# Patient Record
Sex: Male | Born: 1995 | Race: Black or African American | Hispanic: No | Marital: Single | Smoking: Never smoker
Health system: Southern US, Community
[De-identification: ages and names within clinical notes are randomized; demographics above are authoritative.]

## PROBLEM LIST (undated history)

## (undated) DIAGNOSIS — J302 Other seasonal allergic rhinitis: Secondary | ICD-10-CM

---
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
## 2019-01-28 ENCOUNTER — Emergency Department (HOSPITAL_COMMUNITY): Payer: Medicaid Other

## 2019-01-28 ENCOUNTER — Emergency Department (HOSPITAL_COMMUNITY): Payer: Medicaid Other | Admitting: Certified Registered"

## 2019-01-28 ENCOUNTER — Encounter (HOSPITAL_COMMUNITY): Admission: EM | Disposition: E | Payer: Self-pay | Source: Home / Self Care

## 2019-01-28 ENCOUNTER — Inpatient Hospital Stay (HOSPITAL_COMMUNITY)
Admission: EM | Admit: 2019-01-28 | Discharge: 2019-02-27 | DRG: 329 | Disposition: E | Payer: Medicaid Other | Attending: General Surgery | Admitting: General Surgery

## 2019-01-28 DIAGNOSIS — Y249XXA Unspecified firearm discharge, undetermined intent, initial encounter: Secondary | ICD-10-CM

## 2019-01-28 DIAGNOSIS — R578 Other shock: Secondary | ICD-10-CM | POA: Diagnosis present

## 2019-01-28 DIAGNOSIS — Y92410 Unspecified street and highway as the place of occurrence of the external cause: Secondary | ICD-10-CM | POA: Diagnosis not present

## 2019-01-28 DIAGNOSIS — S35515A Injury of left iliac vein, initial encounter: Secondary | ICD-10-CM | POA: Diagnosis present

## 2019-01-28 DIAGNOSIS — S81832A Puncture wound without foreign body, left lower leg, initial encounter: Secondary | ICD-10-CM | POA: Diagnosis present

## 2019-01-28 DIAGNOSIS — W3400XA Accidental discharge from unspecified firearms or gun, initial encounter: Secondary | ICD-10-CM

## 2019-01-28 DIAGNOSIS — R58 Hemorrhage, not elsewhere classified: Secondary | ICD-10-CM | POA: Diagnosis present

## 2019-01-28 DIAGNOSIS — I468 Cardiac arrest due to other underlying condition: Secondary | ICD-10-CM | POA: Diagnosis present

## 2019-01-28 DIAGNOSIS — Z20822 Contact with and (suspected) exposure to covid-19: Secondary | ICD-10-CM | POA: Diagnosis present

## 2019-01-28 DIAGNOSIS — S31634A Puncture wound without foreign body of abdominal wall, left lower quadrant with penetration into peritoneal cavity, initial encounter: Secondary | ICD-10-CM | POA: Diagnosis present

## 2019-01-28 DIAGNOSIS — S36498A Other injury of other part of small intestine, initial encounter: Secondary | ICD-10-CM | POA: Diagnosis present

## 2019-01-28 DIAGNOSIS — S81831A Puncture wound without foreign body, right lower leg, initial encounter: Secondary | ICD-10-CM | POA: Diagnosis present

## 2019-01-28 DIAGNOSIS — S35512A Injury of left iliac artery, initial encounter: Secondary | ICD-10-CM | POA: Diagnosis present

## 2019-01-28 HISTORY — PX: REPAIR ILIAC ARTERY: SHX6216

## 2019-01-28 HISTORY — PX: LAPAROTOMY: SHX154

## 2019-01-28 LAB — I-STAT CHEM 8, ED
BUN: 13 mg/dL (ref 6–20)
Calcium, Ion: 1.06 mmol/L — ABNORMAL LOW (ref 1.15–1.40)
Chloride: 115 mmol/L — ABNORMAL HIGH (ref 98–111)
Creatinine, Ser: 1.8 mg/dL — ABNORMAL HIGH (ref 0.61–1.24)
Glucose, Bld: 42 mg/dL — CL (ref 70–99)
HCT: 29 % — ABNORMAL LOW (ref 39.0–52.0)
Hemoglobin: 9.9 g/dL — ABNORMAL LOW (ref 13.0–17.0)
Potassium: 3.4 mmol/L — ABNORMAL LOW (ref 3.5–5.1)
Sodium: 152 mmol/L — ABNORMAL HIGH (ref 135–145)
TCO2: 16 mmol/L — ABNORMAL LOW (ref 22–32)

## 2019-01-28 LAB — CBC
HCT: 34.7 % — ABNORMAL LOW (ref 39.0–52.0)
Hemoglobin: 10.5 g/dL — ABNORMAL LOW (ref 13.0–17.0)
MCH: 30.7 pg (ref 26.0–34.0)
MCHC: 30.3 g/dL (ref 30.0–36.0)
MCV: 101.5 fL — ABNORMAL HIGH (ref 80.0–100.0)
Platelets: 121 10*3/uL — ABNORMAL LOW (ref 150–400)
RBC: 3.42 MIL/uL — ABNORMAL LOW (ref 4.22–5.81)
RDW: 11.3 % — ABNORMAL LOW (ref 11.5–15.5)
WBC: 4.2 10*3/uL (ref 4.0–10.5)
nRBC: 0 % (ref 0.0–0.2)

## 2019-01-28 LAB — RESPIRATORY PANEL BY RT PCR (FLU A&B, COVID)
Influenza A by PCR: NEGATIVE
Influenza B by PCR: NEGATIVE
SARS Coronavirus 2 by RT PCR: NEGATIVE

## 2019-01-28 LAB — DIC (DISSEMINATED INTRAVASCULAR COAGULATION)PANEL
D-Dimer, Quant: 15.92 ug/mL-FEU — ABNORMAL HIGH (ref 0.00–0.50)
Fibrinogen: 150 mg/dL — ABNORMAL LOW (ref 210–475)
INR: 1.5 — ABNORMAL HIGH (ref 0.8–1.2)
Platelets: 129 10*3/uL — ABNORMAL LOW (ref 150–400)
Prothrombin Time: 17.9 seconds — ABNORMAL HIGH (ref 11.4–15.2)
Smear Review: NONE SEEN
aPTT: 30 seconds (ref 24–36)

## 2019-01-28 LAB — COMPREHENSIVE METABOLIC PANEL
ALT: 40 U/L (ref 0–44)
AST: 30 U/L (ref 15–41)
Albumin: 2.6 g/dL — ABNORMAL LOW (ref 3.5–5.0)
Alkaline Phosphatase: 41 U/L (ref 38–126)
Anion gap: 22 — ABNORMAL HIGH (ref 5–15)
BUN: 15 mg/dL (ref 6–20)
CO2: 12 mmol/L — ABNORMAL LOW (ref 22–32)
Calcium: 8.2 mg/dL — ABNORMAL LOW (ref 8.9–10.3)
Chloride: 119 mmol/L — ABNORMAL HIGH (ref 98–111)
Creatinine, Ser: 1.82 mg/dL — ABNORMAL HIGH (ref 0.61–1.24)
GFR calc Af Amer: >60 mL/min (ref >60)
GFR calc non Af Amer: 52 mL/min — ABNORMAL LOW (ref >60)
Glucose, Bld: 51 mg/dL — ABNORMAL LOW (ref 70–99)
Potassium: 3.6 mmol/L (ref 3.5–5.1)
Sodium: 153 mmol/L — ABNORMAL HIGH (ref 135–145)
Total Bilirubin: 0.3 mg/dL (ref 0.3–1.2)
Total Protein: 4.1 g/dL — ABNORMAL LOW (ref 6.5–8.1)

## 2019-01-28 LAB — ETHANOL: Alcohol, Ethyl (B): 45 mg/dL — ABNORMAL HIGH (ref <10)

## 2019-01-28 LAB — CDS SEROLOGY

## 2019-01-28 SURGERY — LAPAROTOMY, EXPLORATORY
Anesthesia: General | Site: Abdomen

## 2019-01-28 MED ORDER — CEFAZOLIN SODIUM-DEXTROSE 2-3 GM-%(50ML) IV SOLR
INTRAVENOUS | Status: DC | PRN
  Administered 2019-01-28: 2 g via INTRAVENOUS

## 2019-01-28 MED ORDER — CALCIUM CHLORIDE 10 % IV SOLN
INTRAVENOUS | Status: AC | PRN
  Administered 2019-01-28: 1 g via INTRAVENOUS

## 2019-01-28 MED ORDER — PHENYLEPHRINE HCL (PRESSORS) 10 MG/ML IV SOLN
INTRAVENOUS | Status: DC | PRN
  Administered 2019-01-28: 200 ug via INTRAVENOUS

## 2019-01-28 MED ORDER — ROCURONIUM BROMIDE 10 MG/ML (PF) SYRINGE
PREFILLED_SYRINGE | INTRAVENOUS | Status: AC
  Filled 2019-01-28: qty 10

## 2019-01-28 MED ORDER — EPINEPHRINE HCL 5 MG/250ML IV SOLN IN NS
INTRAVENOUS | Status: DC | PRN
  Administered 2019-01-28: 5 ug/min via INTRAVENOUS

## 2019-01-28 MED ORDER — MIDAZOLAM HCL 2 MG/2ML IJ SOLN
INTRAMUSCULAR | Status: AC
  Filled 2019-01-28: qty 2

## 2019-01-28 MED ORDER — EPINEPHRINE 1 MG/10ML IJ SOSY
PREFILLED_SYRINGE | INTRAMUSCULAR | Status: AC | PRN
  Administered 2019-01-28 (×2): 1 via INTRAVENOUS

## 2019-01-28 MED ORDER — ROCURONIUM BROMIDE 50 MG/5ML IV SOLN
INTRAVENOUS | Status: AC | PRN
  Administered 2019-01-28: 100 mg via INTRAVENOUS

## 2019-01-28 MED ORDER — MIDAZOLAM HCL 2 MG/2ML IJ SOLN
INTRAMUSCULAR | Status: DC | PRN
  Administered 2019-01-28: 2 mg via INTRAVENOUS

## 2019-01-28 MED ORDER — SODIUM BICARBONATE 8.4 % IV SOLN
INTRAVENOUS | Status: DC | PRN
  Administered 2019-01-28 (×2): 50 meq via INTRAVENOUS

## 2019-01-28 MED ORDER — LACTATED RINGERS IV SOLN: INTRAVENOUS | Status: DC | PRN

## 2019-01-28 MED ORDER — CEFAZOLIN SODIUM 1 G IJ SOLR
INTRAMUSCULAR | Status: AC
  Filled 2019-01-28: qty 20

## 2019-01-28 MED ORDER — PHENYLEPHRINE 40 MCG/ML (10ML) SYRINGE FOR IV PUSH (FOR BLOOD PRESSURE SUPPORT)
PREFILLED_SYRINGE | INTRAVENOUS | Status: AC
  Filled 2019-01-28: qty 10

## 2019-01-28 MED ORDER — EPINEPHRINE PF 1 MG/ML IJ SOLN
INTRAMUSCULAR | Status: DC | PRN
  Administered 2019-01-28: .2 mg via INTRAVENOUS
  Administered 2019-01-28: .1 mg via INTRAVENOUS
  Administered 2019-01-28 (×2): .2 mg via INTRAVENOUS

## 2019-01-28 MED ORDER — 0.9 % SODIUM CHLORIDE (POUR BTL) OPTIME
TOPICAL | Status: DC | PRN
  Administered 2019-01-28: 1000 mL

## 2019-01-28 MED ORDER — ROCURONIUM 10MG/ML (10ML) SYRINGE FOR MEDFUSION PUMP - OPTIME
INTRAVENOUS | Status: DC | PRN
  Administered 2019-01-28: 100 mg via INTRAVENOUS

## 2019-01-28 MED ORDER — FENTANYL CITRATE (PF) 250 MCG/5ML IJ SOLN
INTRAMUSCULAR | Status: AC
  Filled 2019-01-28: qty 5

## 2019-01-28 MED ORDER — SODIUM CHLORIDE 0.9 % IV SOLN: INTRAVENOUS | Status: DC | PRN

## 2019-01-28 SURGICAL SUPPLY — 50 items
BLADE CLIPPER SURG (BLADE) ×1 IMPLANT
CANISTER SUCT 3000ML PPV (MISCELLANEOUS) ×3 IMPLANT
COVER SURGICAL LIGHT HANDLE (MISCELLANEOUS) ×3 IMPLANT
DRAPE LAPAROSCOPIC ABDOMINAL (DRAPES) ×3 IMPLANT
DRAPE WARM FLUID 44X44 (DRAPES) ×3 IMPLANT
DRSG COVADERM 4X14 (GAUZE/BANDAGES/DRESSINGS) ×1 IMPLANT
ELECT BLADE 6.5 EXT (BLADE) ×1 IMPLANT
ELECT CAUTERY BLADE 6.4 (BLADE) ×3 IMPLANT
ELECT REM PT RETURN 9FT ADLT (ELECTROSURGICAL) ×3
ELECTRODE REM PT RTRN 9FT ADLT (ELECTROSURGICAL) ×2 IMPLANT
GAUZE SPONGE 4X4 12PLY STRL (GAUZE/BANDAGES/DRESSINGS) ×3 IMPLANT
GAUZE SPONGE 4X4 12PLY STRL LF (GAUZE/BANDAGES/DRESSINGS) ×2 IMPLANT
GLOVE BIO SURGEON STRL SZ8 (GLOVE) ×6 IMPLANT
GLOVE BIOGEL PI IND STRL 8 (GLOVE) ×2 IMPLANT
GLOVE BIOGEL PI INDICATOR 8 (GLOVE) ×4
GOWN STRL REUS W/ TWL LRG LVL3 (GOWN DISPOSABLE) ×2 IMPLANT
GOWN STRL REUS W/ TWL XL LVL3 (GOWN DISPOSABLE) ×2 IMPLANT
GOWN STRL REUS W/TWL LRG LVL3 (GOWN DISPOSABLE) ×9
GOWN STRL REUS W/TWL XL LVL3 (GOWN DISPOSABLE) ×9
HANDLE SUCTION POOLE (INSTRUMENTS) ×2 IMPLANT
KIT BASIN OR (CUSTOM PROCEDURE TRAY) ×3 IMPLANT
KIT TURNOVER KIT B (KITS) ×3 IMPLANT
LIGACLIP MED TITANIUM (CLIP) ×1 IMPLANT
LIGASURE IMPACT 36 18CM CVD LR (INSTRUMENTS) ×2 IMPLANT
LOOP VESSEL MAXI BLUE (MISCELLANEOUS) ×2 IMPLANT
LOOP VESSEL MINI RED (MISCELLANEOUS) ×1 IMPLANT
NS IRRIG 1000ML POUR BTL (IV SOLUTION) ×6 IMPLANT
PACK GENERAL/GYN (CUSTOM PROCEDURE TRAY) ×3 IMPLANT
PAD ABD 8X10 STRL (GAUZE/BANDAGES/DRESSINGS) ×6 IMPLANT
PAD ARMBOARD 7.5X6 YLW CONV (MISCELLANEOUS) ×3 IMPLANT
PENCIL SMOKE EVACUATOR (MISCELLANEOUS) ×3 IMPLANT
PLUG CATH AND CAP STER (CATHETERS) ×1 IMPLANT
RELOAD PROXIMATE 75MM BLUE (ENDOMECHANICALS) ×6 IMPLANT
RELOAD STAPLE 75 3.8 BLU REG (ENDOMECHANICALS) IMPLANT
SPONGE LAP 18X18 RF (DISPOSABLE) ×16 IMPLANT
STAPLER PROXIMATE 75MM BLUE (STAPLE) ×1 IMPLANT
SUCTION POOLE HANDLE (INSTRUMENTS) ×3
SUT ETHILON 1 TP 1 60 (SUTURE) ×2 IMPLANT
SUT ETHILON O TP 1 (SUTURE) ×1 IMPLANT
SUT PROLENE 3 0 SH 48 (SUTURE) ×7 IMPLANT
SUT PROLENE 5 0 C1 (SUTURE) ×2 IMPLANT
SUT SILK 2 0 SH CR/8 (SUTURE) ×3 IMPLANT
SUT SILK 2 0 TIES 10X30 (SUTURE) ×3 IMPLANT
SUT SILK 3 0 SH CR/8 (SUTURE) ×3 IMPLANT
SUT SILK 3 0 TIES 10X30 (SUTURE) ×3 IMPLANT
SYR 20ML ECCENTRIC (SYRINGE) ×1 IMPLANT
TAPE CLOTH SURG 6X10 WHT LF (GAUZE/BANDAGES/DRESSINGS) ×3 IMPLANT
TOWEL GREEN STERILE (TOWEL DISPOSABLE) ×4 IMPLANT
TRAY FOLEY MTR SLVR 16FR STAT (SET/KITS/TRAYS/PACK) ×2 IMPLANT
YANKAUER SUCT BULB TIP NO VENT (SUCTIONS) ×1 IMPLANT

## 2019-01-29 LAB — PREPARE PLATELET PHERESIS
Unit division: 0
Unit division: 0
Unit division: 0
Unit division: 0

## 2019-01-29 LAB — PREPARE FRESH FROZEN PLASMA
Unit division: 0
Unit division: 0
Unit division: 0
Unit division: 0
Unit division: 0
Unit division: 0
Unit division: 0
Unit division: 0
Unit division: 0
Unit division: 0
Unit division: 0
Unit division: 0
Unit division: 0
Unit division: 0
Unit division: 0
Unit division: 0
Unit division: 0
Unit division: 0
Unit division: 0
Unit division: 0
Unit division: 0
Unit division: 0

## 2019-01-29 LAB — BPAM FFP
Blood Product Expiration Date: 202101072359
Blood Product Expiration Date: 202101072359
Blood Product Expiration Date: 202101072359
Blood Product Expiration Date: 202101072359
Blood Product Expiration Date: 202101072359
Blood Product Expiration Date: 202101072359
Blood Product Expiration Date: 202101072359
Blood Product Expiration Date: 202101072359
Blood Product Expiration Date: 202101072359
Blood Product Expiration Date: 202101072359
Blood Product Expiration Date: 202101072359
Blood Product Expiration Date: 202101072359
Blood Product Expiration Date: 202101072359
Blood Product Expiration Date: 202101072359
Blood Product Expiration Date: 202101072359
Blood Product Expiration Date: 202101072359
Blood Product Expiration Date: 202101072359
Blood Product Expiration Date: 202101122359
Blood Product Expiration Date: 202101162359
Blood Product Expiration Date: 202101162359
Blood Product Expiration Date: 202101162359
Blood Product Expiration Date: 202101172359
Blood Product Expiration Date: 202101172359
Blood Product Expiration Date: 202101182359
Blood Product Expiration Date: 202101182359
Blood Product Expiration Date: 202101192359
ISSUE DATE / TIME: 202101021948
ISSUE DATE / TIME: 202101021948
ISSUE DATE / TIME: 202101021950
ISSUE DATE / TIME: 202101021950
ISSUE DATE / TIME: 202101022008
ISSUE DATE / TIME: 202101022008
ISSUE DATE / TIME: 202101022008
ISSUE DATE / TIME: 202101022008
ISSUE DATE / TIME: 202101022014
ISSUE DATE / TIME: 202101022014
ISSUE DATE / TIME: 202101022056
ISSUE DATE / TIME: 202101022056
ISSUE DATE / TIME: 202101022056
ISSUE DATE / TIME: 202101022056
ISSUE DATE / TIME: 202101022111
ISSUE DATE / TIME: 202101022111
ISSUE DATE / TIME: 202101022111
ISSUE DATE / TIME: 202101022111
ISSUE DATE / TIME: 202101022122
ISSUE DATE / TIME: 202101022122
ISSUE DATE / TIME: 202101030455
ISSUE DATE / TIME: 202101030455
ISSUE DATE / TIME: 202101030455
ISSUE DATE / TIME: 202101030455
ISSUE DATE / TIME: 202101031038
ISSUE DATE / TIME: 202101031038
Unit Type and Rh: 1700
Unit Type and Rh: 1700
Unit Type and Rh: 6200
Unit Type and Rh: 6200
Unit Type and Rh: 6200
Unit Type and Rh: 6200
Unit Type and Rh: 6200
Unit Type and Rh: 6200
Unit Type and Rh: 6200
Unit Type and Rh: 6200
Unit Type and Rh: 6200
Unit Type and Rh: 6200
Unit Type and Rh: 6200
Unit Type and Rh: 6200
Unit Type and Rh: 6200
Unit Type and Rh: 6200
Unit Type and Rh: 6200
Unit Type and Rh: 6200
Unit Type and Rh: 6200
Unit Type and Rh: 6200
Unit Type and Rh: 7300
Unit Type and Rh: 7300
Unit Type and Rh: 7300
Unit Type and Rh: 7300
Unit Type and Rh: 7300
Unit Type and Rh: 7300

## 2019-01-29 LAB — BPAM PLATELET PHERESIS
Blood Product Expiration Date: 202101042359
Blood Product Expiration Date: 202101042359
Blood Product Expiration Date: 202101042359
Blood Product Expiration Date: 202101052359
ISSUE DATE / TIME: 202101022000
ISSUE DATE / TIME: 202101022101
ISSUE DATE / TIME: 202101022138
ISSUE DATE / TIME: 202101030823
Unit Type and Rh: 5100
Unit Type and Rh: 5100
Unit Type and Rh: 6200
Unit Type and Rh: 7300

## 2019-01-29 LAB — BPAM CRYOPRECIPITATE
Blood Product Expiration Date: 202101030212
Blood Product Expiration Date: 202101030305
ISSUE DATE / TIME: 202101022038
ISSUE DATE / TIME: 202101022140
Unit Type and Rh: 5100
Unit Type and Rh: 6200

## 2019-01-29 LAB — PREPARE CRYOPRECIPITATE
Unit division: 0
Unit division: 0

## 2019-01-29 LAB — MASSIVE TRANSFUSION PROTOCOL ORDER (BLOOD BANK NOTIFICATION)

## 2019-01-29 LAB — ABO/RH: ABO/RH(D): B POS

## 2019-01-29 MED FILL — Medication: Qty: 1 | Status: AC

## 2019-01-30 LAB — BPAM RBC
Blood Product Expiration Date: 202101192359
Blood Product Expiration Date: 202101262359
Blood Product Expiration Date: 202102012359
Blood Product Expiration Date: 202102012359
Blood Product Expiration Date: 202102012359
Blood Product Expiration Date: 202102012359
Blood Product Expiration Date: 202102012359
Blood Product Expiration Date: 202102012359
Blood Product Expiration Date: 202102022359
Blood Product Expiration Date: 202102022359
Blood Product Expiration Date: 202102022359
Blood Product Expiration Date: 202102022359
Blood Product Expiration Date: 202102022359
Blood Product Expiration Date: 202102022359
Blood Product Expiration Date: 202102022359
Blood Product Expiration Date: 202102022359
Blood Product Expiration Date: 202102022359
Blood Product Expiration Date: 202102022359
Blood Product Expiration Date: 202102032359
Blood Product Expiration Date: 202102032359
Blood Product Expiration Date: 202102032359
Blood Product Expiration Date: 202102032359
Blood Product Expiration Date: 202102032359
Blood Product Expiration Date: 202102032359
Blood Product Expiration Date: 202102032359
Blood Product Expiration Date: 202102032359
Blood Product Expiration Date: 202102032359
Blood Product Expiration Date: 202102032359
Blood Product Expiration Date: 202102032359
Blood Product Expiration Date: 202102032359
Blood Product Expiration Date: 202102032359
Blood Product Expiration Date: 202102032359
Blood Product Expiration Date: 202102032359
Blood Product Expiration Date: 202102032359
Blood Product Expiration Date: 202102032359
Blood Product Expiration Date: 202102032359
Blood Product Expiration Date: 202102032359
Blood Product Expiration Date: 202102032359
Blood Product Expiration Date: 202102042359
Blood Product Expiration Date: 202102042359
Blood Product Expiration Date: 202102042359
Blood Product Expiration Date: 202102042359
ISSUE DATE / TIME: 202101021947
ISSUE DATE / TIME: 202101021947
ISSUE DATE / TIME: 202101021948
ISSUE DATE / TIME: 202101021948
ISSUE DATE / TIME: 202101021949
ISSUE DATE / TIME: 202101021949
ISSUE DATE / TIME: 202101022005
ISSUE DATE / TIME: 202101022005
ISSUE DATE / TIME: 202101022005
ISSUE DATE / TIME: 202101022005
ISSUE DATE / TIME: 202101022018
ISSUE DATE / TIME: 202101022018
ISSUE DATE / TIME: 202101022018
ISSUE DATE / TIME: 202101022018
ISSUE DATE / TIME: 202101022043
ISSUE DATE / TIME: 202101022043
ISSUE DATE / TIME: 202101022043
ISSUE DATE / TIME: 202101022043
ISSUE DATE / TIME: 202101022059
ISSUE DATE / TIME: 202101022059
ISSUE DATE / TIME: 202101022059
ISSUE DATE / TIME: 202101022059
ISSUE DATE / TIME: 202101022104
ISSUE DATE / TIME: 202101022104
ISSUE DATE / TIME: 202101022104
ISSUE DATE / TIME: 202101022104
ISSUE DATE / TIME: 202101022123
ISSUE DATE / TIME: 202101022123
ISSUE DATE / TIME: 202101022123
ISSUE DATE / TIME: 202101022123
ISSUE DATE / TIME: 202101022126
ISSUE DATE / TIME: 202101022126
ISSUE DATE / TIME: 202101022137
ISSUE DATE / TIME: 202101022137
ISSUE DATE / TIME: 202101022137
ISSUE DATE / TIME: 202101022143
ISSUE DATE / TIME: 202101022143
ISSUE DATE / TIME: 202101031530
ISSUE DATE / TIME: 202101031554
ISSUE DATE / TIME: 202101031824
ISSUE DATE / TIME: 202101040028
ISSUE DATE / TIME: 202101040848
Unit Type and Rh: 5100
Unit Type and Rh: 5100
Unit Type and Rh: 5100
Unit Type and Rh: 5100
Unit Type and Rh: 5100
Unit Type and Rh: 5100
Unit Type and Rh: 5100
Unit Type and Rh: 5100
Unit Type and Rh: 5100
Unit Type and Rh: 5100
Unit Type and Rh: 5100
Unit Type and Rh: 5100
Unit Type and Rh: 5100
Unit Type and Rh: 5100
Unit Type and Rh: 5100
Unit Type and Rh: 5100
Unit Type and Rh: 5100
Unit Type and Rh: 5100
Unit Type and Rh: 5100
Unit Type and Rh: 5100
Unit Type and Rh: 5100
Unit Type and Rh: 5100
Unit Type and Rh: 5100
Unit Type and Rh: 5100
Unit Type and Rh: 5100
Unit Type and Rh: 5100
Unit Type and Rh: 5100
Unit Type and Rh: 5100
Unit Type and Rh: 5100
Unit Type and Rh: 5100
Unit Type and Rh: 7300
Unit Type and Rh: 7300
Unit Type and Rh: 7300
Unit Type and Rh: 7300
Unit Type and Rh: 7300
Unit Type and Rh: 7300
Unit Type and Rh: 7300
Unit Type and Rh: 7300
Unit Type and Rh: 7300
Unit Type and Rh: 7300
Unit Type and Rh: 7300
Unit Type and Rh: 7300

## 2019-01-30 LAB — TYPE AND SCREEN
ABO/RH(D): B POS
Antibody Screen: NEGATIVE
Unit division: 0
Unit division: 0
Unit division: 0
Unit division: 0
Unit division: 0
Unit division: 0
Unit division: 0
Unit division: 0
Unit division: 0
Unit division: 0
Unit division: 0
Unit division: 0
Unit division: 0
Unit division: 0
Unit division: 0
Unit division: 0
Unit division: 0
Unit division: 0
Unit division: 0
Unit division: 0
Unit division: 0
Unit division: 0
Unit division: 0
Unit division: 0
Unit division: 0
Unit division: 0
Unit division: 0
Unit division: 0
Unit division: 0
Unit division: 0
Unit division: 0
Unit division: 0
Unit division: 0
Unit division: 0
Unit division: 0
Unit division: 0
Unit division: 0
Unit division: 0
Unit division: 0
Unit division: 0
Unit division: 0
Unit division: 0
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
## 2019-02-27 NOTE — Anesthesia Procedure Notes (Signed)
Arterial Line Insertion Start/End02-01-2019 8:50 PM, 2019/02/27 8:52 PM Performed by: Achille Rich, MD, anesthesiologist  Patient location: OR. Preanesthetic checklist: patient identified, IV checked, site marked and monitors and equipment checked Patient sedated Right, radial was placed Catheter size: 20 Fr Hand hygiene performed  and maximum sterile barriers used   Attempts: 1 Procedure performed without using ultrasound guided technique. Following insertion, dressing applied and Biopatch. Post procedure assessment: normal and unchanged
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
## 2019-02-27 NOTE — ED Notes (Signed)
Dr. Janee Morn in OR with patient notified of patients CXR result, 20% R sided pneumo

## 2019-02-27 NOTE — Transfer of Care (Signed)
OR death pronounced at 2149 hrs

## 2019-02-27 NOTE — ED Notes (Signed)
Pulses present 2000

## 2019-02-27 NOTE — Procedures (Signed)
Central Venous Catheter Insertion Procedure Note Joe Stevenson [id number] Jun 08, 1995  Procedure: Insertion of Central Venous Catheter Indications: Hemorrhagic shock  Procedure Details Consent: Emergency Time Out: Verified patient identification, verified procedure, site/side was marked, verified correct patient position, special equipment/implants available, medications/allergies/relevent history reviewed, required imaging and test results available.  Performed  Maximum sterile technique was used including antiseptics, cap, gloves, gown, hand hygiene, mask and sheet. Skin prep: Chlorhexidine; local anesthetic administered A antimicrobial bonded/coated triple lumen catheter was placed in the right femoral vein due to emergent situation using the Seldinger technique.  Evaluation Blood flow good Complications: No apparent complications Patient did tolerate procedure well. Hemorrhagic shock. Late entry, done at 1950  Liz Malady Feb 17, 2019, 10:56 PM

## 2019-02-27 NOTE — Anesthesia Preprocedure Evaluation (Signed)
Anesthesia Evaluation   Patient unresponsive  Preop documentation limited or incomplete due to emergent nature of procedure.  Airway Mallampati: Intubated       Dental   Pulmonary     + decreased breath sounds      Cardiovascular  Rhythm:regular Rate:Tachycardia     Neuro/Psych    GI/Hepatic   Endo/Other    Renal/GU      Musculoskeletal   Abdominal   Peds  Hematology   Anesthesia Other Findings   Reproductive/Obstetrics                             Anesthesia Physical Anesthesia Plan  ASA: V and emergent  Anesthesia Plan: General   Post-op Pain Management:    Induction: Inhalational  PONV Risk Score and Plan: Treatment may vary due to age or medical condition  Airway Management Planned: Oral ETT  Additional Equipment: Arterial line and CVP  Intra-op Plan:   Post-operative Plan: Post-operative intubation/ventilation  Informed Consent:     Only emergency history available  Plan Discussed with: CRNA, Anesthesiologist and Surgeon  Anesthesia Plan Comments:         Anesthesia Quick Evaluation
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
## 2019-02-27 NOTE — Anesthesia Postprocedure Evaluation (Signed)
Anesthesia Post Note  Patient: Joe Stevenson  Procedure(s) Performed: EXPLORATORY LAPAROTOMY, BOWEL RESECTION  (N/A ) Repair Iliac Artery and Vein (Left Abdomen)     Anesthesia Type: General Comments: Pt expired in the OR.    Last Vitals:  Vitals:   02/06/2019 2007  BP: (!) 114/47  Pulse: (!) 127  Resp: 18  SpO2: 100%    Last Pain: There were no vitals filed for this visit.               Robyn Nohr S

## 2019-02-27 NOTE — Anesthesia Procedure Notes (Signed)
Central Venous Catheter Insertion Performed by: Albertha Ghee, MD, anesthesiologist Start/EndJanuary 13, 2021 9:11 PM, 2019-02-08 9:14 PM Patient location: OR. Preanesthetic checklist: patient identified, IV checked, site marked, risks and benefits discussed, surgical consent, monitors and equipment checked, pre-op evaluation, timeout performed and anesthesia consent Patient sedated Hand hygiene performed  and maximum sterile barriers used  Catheter size: 9 Fr Central line was placed.MAC introducer Procedure performed without using ultrasound guided technique. Attempts: 1 Following insertion, line sutured, dressing applied and Biopatch. Post procedure assessment: blood return through all ports, free fluid flow and no air  Patient tolerated the procedure well with no immediate complications.

## 2019-02-27 DEATH — deceased
